# Patient Record
Sex: Female | Born: 1985 | Race: White | Hispanic: No | Marital: Single | Smoking: Former smoker
Health system: Southern US, Community
[De-identification: ages and names within clinical notes are randomized; demographics above are authoritative.]

## PROBLEM LIST (undated history)

## (undated) DIAGNOSIS — L739 Follicular disorder, unspecified: Secondary | ICD-10-CM

## (undated) HISTORY — PX: NO PAST SURGERIES: SHX2092

## (undated) HISTORY — PX: TONSILLECTOMY: SUR1361

---

## 2009-10-15 ENCOUNTER — Emergency Department (HOSPITAL_COMMUNITY): Admission: EM | Admit: 2009-10-15 | Discharge: 2009-10-15 | Payer: Self-pay | Admitting: Emergency Medicine

## 2009-10-28 ENCOUNTER — Emergency Department (HOSPITAL_COMMUNITY): Admission: EM | Admit: 2009-10-28 | Discharge: 2009-10-28 | Payer: Self-pay | Admitting: Emergency Medicine
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
## 2010-12-21 LAB — HEMOCCULT GUIAC POC 1CARD (OFFICE): Fecal Occult Bld: NEGATIVE

## 2010-12-21 LAB — POCT I-STAT, CHEM 8
BUN: 11 mg/dL (ref 6–23)
HCT: 40 % (ref 36.0–46.0)
Hemoglobin: 13.6 g/dL (ref 12.0–15.0)
Sodium: 141 mEq/L (ref 135–145)
TCO2: 28 mmol/L (ref 0–100)

## 2010-12-21 LAB — PREGNANCY, URINE: Preg Test, Ur: NEGATIVE

## 2012-07-18 ENCOUNTER — Ambulatory Visit (INDEPENDENT_AMBULATORY_CARE_PROVIDER_SITE_OTHER): Payer: Self-pay | Admitting: Family Medicine

## 2012-07-18 ENCOUNTER — Encounter: Payer: Self-pay | Admitting: Family Medicine

## 2012-07-18 VITALS — BP 115/75 | HR 75 | Ht 63.0 in | Wt 125.0 lb

## 2012-07-18 DIAGNOSIS — N6310 Unspecified lump in the right breast, unspecified quadrant: Secondary | ICD-10-CM

## 2012-07-18 DIAGNOSIS — N63 Unspecified lump in unspecified breast: Secondary | ICD-10-CM

## 2012-07-18 DIAGNOSIS — R5383 Other fatigue: Secondary | ICD-10-CM

## 2012-07-18 DIAGNOSIS — R5381 Other malaise: Secondary | ICD-10-CM

## 2012-07-18 NOTE — Progress Notes (Signed)
Subjective:    Patient ID: Samantha Rodgers, female    DOB: 29-Jun-1986, 26 y.o.   MRN: [id number]  HPI Lump in breast x 2 years and occas pain, no discharge.  She is on OCP.  No discharge from the nipples.  Sometimes has pain in her ribs and occ feels SOB with it. The lumps have been constant and have gotten a lot of larger. Feels tired all the time.  No fever.  Periods are fairly regularly.  Says peroids are heavy first couple of days. 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 Has been anemic in the past. Does take OTC iron. MGM with BrCa.    Fatigue-she complains of feeling tired all the time. She does not sleep well but this is been chronic. She says she remembers not sleeping well her entire life. On average she only gets about 5-6 hours. She says she gets up from her father. No other recent changes. No fevers. She does have a lump in her breast but no other lumps or bumps that she is worried about. She does have heavy periods the first 2 days but then it tapers off. She's on birth control.    Review of Systems  Constitutional: Negative for fever, diaphoresis and unexpected weight change.  HENT: Negative for hearing loss, rhinorrhea and tinnitus.   Eyes: Negative for visual disturbance.  Respiratory: Negative for cough and wheezing.   Cardiovascular: Negative for chest pain and palpitations.  Gastrointestinal: Negative for nausea, vomiting, diarrhea and blood in stool.  Genitourinary: Negative for vaginal bleeding, vaginal discharge and difficulty urinating.  Musculoskeletal: Negative for myalgias and arthralgias.  Skin: Negative for rash.  Neurological: Negative for headaches.  Hematological: Negative for adenopathy. Does not bruise/bleed easily.  Psychiatric/Behavioral: Negative for disturbed wake/sleep cycle and dysphoric mood. The patient is not nervous/anxious.    BP 115/75  Pulse 75  Ht 5\' 3"  (1.6 m)  Wt 125 lb (56.7 kg)  BMI 22.14 kg/m2    No Known Allergies  History reviewed. No pertinent  past medical history.  Past Surgical History  Procedure Date  . No past surgeries     History   Social History  . Marital Status: Single    Spouse Name: N/A    Number of Children: N/A  . Years of Education: N/A   Occupational History  . Store Marine scientist   Social History Main Topics  . Smoking status: Former Games developer  . Smokeless tobacco: Not on file  . Alcohol Use: No  . Drug Use: No  . Sexually Active: Yes    Birth Control/ Protection: Pill   Other Topics Concern  . Not on file   Social History Narrative   No caffeine intake.      Family History  Problem Relation Age of Onset  . Cancer Maternal Grandmother   . Hypertension Maternal Grandmother   . Diabetes Maternal Grandmother   . Leukemia Paternal Grandfather   . Diabetes Maternal Grandmother     Outpatient Encounter Prescriptions as of 07/18/2012  Medication Sig Dispense Refill  . Norgestim-Eth Estrad Triphasic (TRI-SPRINTEC PO) Take 1 tablet by mouth daily.              Objective:   Physical Exam  Constitutional: She appears well-developed and well-nourished.  HENT:  Head: Normocephalic and atraumatic.  Pulmonary/Chest: Right breast exhibits mass. Right breast exhibits no inverted nipple, no nipple discharge, no skin change and no tenderness. Left breast exhibits no inverted nipple, no nipple  discharge, no skin change and no tenderness. Breasts are symmetrical.    Skin: Skin is warm and dry.  Psychiatric: She has a normal mood and affect. Her behavior is normal.          Assessment & Plan:  Breast lump-will refer to imaging for ultrasound and diagnostic mammogram. Will call with results as it is there are available. Tried to give her reassurance for now.  Fatigue-we'll check a CBC to rule out anemia as well as B12, folic acid and ferritin. She does have a prior history of iron deficiency anemia. We'll also check her thyroid level as well.

## 2012-07-18 NOTE — Patient Instructions (Addendum)
We will call you with your lab results. If you don't here from [place] in about a week then please give [place] a call at [phone].,

## 2012-07-20 ENCOUNTER — Other Ambulatory Visit: Payer: Self-pay | Admitting: Family Medicine

## 2012-07-20 ENCOUNTER — Ambulatory Visit
Admission: RE | Admit: 2012-07-20 | Discharge: 2012-07-20 | Disposition: A | Discharge status: Home / Self Care | Payer: Self-pay | Source: Ambulatory Visit | Attending: Family Medicine | Admitting: Family Medicine

## 2012-07-20 DIAGNOSIS — N6310 Unspecified lump in the right breast, unspecified quadrant: Secondary | ICD-10-CM

## 2012-07-27 ENCOUNTER — Ambulatory Visit (HOSPITAL_COMMUNITY): Payer: Self-pay

## 2012-11-30 ENCOUNTER — Encounter (HOSPITAL_COMMUNITY): Payer: Self-pay | Admitting: *Deleted

## 2012-12-12 ENCOUNTER — Encounter (HOSPITAL_COMMUNITY): Payer: Self-pay

## 2012-12-12 ENCOUNTER — Ambulatory Visit (HOSPITAL_COMMUNITY)
Admission: RE | Admit: 2012-12-12 | Discharge: 2012-12-12 | Disposition: A | Discharge status: Home / Self Care | Payer: Self-pay | Source: Ambulatory Visit | Attending: Obstetrics and Gynecology | Admitting: Obstetrics and Gynecology

## 2012-12-12 ENCOUNTER — Other Ambulatory Visit: Payer: Self-pay | Admitting: Family Medicine

## 2012-12-12 ENCOUNTER — Other Ambulatory Visit: Payer: Self-pay | Admitting: Obstetrics and Gynecology

## 2012-12-12 VITALS — BP 106/62 | Temp 98.6°F | Ht 63.0 in | Wt 129.2 lb

## 2012-12-12 DIAGNOSIS — N6310 Unspecified lump in the right breast, unspecified quadrant: Secondary | ICD-10-CM

## 2012-12-12 DIAGNOSIS — N6311 Unspecified lump in the right breast, upper outer quadrant: Secondary | ICD-10-CM | POA: Insufficient documentation

## 2012-12-12 DIAGNOSIS — Z01419 Encounter for gynecological examination (general) (routine) without abnormal findings: Secondary | ICD-10-CM

## 2012-12-12 NOTE — Patient Instructions (Signed)
Taught patient how to perform BSE. Let her know [facility] will cover Pap smears every 3 years unless has a history of abnormal Pap smears. Referred patient to the [facility] per recommendation for a right breast biopsy. Appointment is scheduled for Friday, December 15, 2012 at 1245. Patient aware of appointment and will be there. Let patient know will follow up with her within the next couple weeks with results for Pap smear by letter or phone. Patient verbalized understanding.

## 2012-12-12 NOTE — Progress Notes (Signed)
Complaints of right breast lump x 3 years that has increased in size. Complained of right breast pain x 1.5 years that comes and goes rating pain at a 9 out of 10. Patient referred to [facility] by the [facility] due to recommendation of right breast biopsy. Right breast ultrasound completed 07/20/2012 at the [facility] [place].  Pap Smear:    Pap smear completed today. Per patient unsure of the date of her last Pap smear though stated is was normal. Per patient no history of abnormal Pap smears. No Pap smear results in EPIC.  Physical exam: Breasts Breasts symmetrical. No skin abnormalities bilateral breasts. No nipple retraction bilateral breasts. No nipple discharge bilateral breasts. No lymphadenopathy. No lumps palpated left breast. Palpated a lump within the right breast at 10 o'clock 1 cm from the nipple. No complaints of pain or tenderness on exam. Patient did state she has severe shooting right breast pain that comes and goes. Referred patient to the [facility] per recommendation for a right breast biopsy. Appointment is scheduled for Friday, December 15, 2012 at 1245.        Pelvic/Bimanual   Ext Genitalia No lesions, no swelling and no discharge observed on external genitalia.          Vagina Vagina pink and normal texture. No lesions and small amount of whitish colored discharge observed in vagina.          Cervix Cervix is present. Cervix pink and of normal texture. Small amount of whitish colored discharge observed on cervical os.    Uterus Uterus is present and palpable. Uterus in normal position and normal size.        Adnexae Bilateral ovaries present and palpable. No tenderness on palpation.          Rectovaginal No rectal exam completed today since patient had no rectal complaints. No skin abnormalities observed on exam.

## 2012-12-15 ENCOUNTER — Other Ambulatory Visit: Payer: Self-pay

## 2012-12-15 ENCOUNTER — Other Ambulatory Visit: Payer: Self-pay | Admitting: Obstetrics and Gynecology

## 2012-12-15 ENCOUNTER — Ambulatory Visit
Admission: RE | Admit: 2012-12-15 | Discharge: 2012-12-15 | Disposition: A | Discharge status: Home / Self Care | Payer: No Typology Code available for payment source | Source: Ambulatory Visit | Attending: Obstetrics and Gynecology | Admitting: Obstetrics and Gynecology

## 2012-12-15 ENCOUNTER — Ambulatory Visit
Admission: RE | Admit: 2012-12-15 | Discharge: 2012-12-15 | Disposition: A | Discharge status: Home / Self Care | Payer: No Typology Code available for payment source | Source: Ambulatory Visit | Attending: Family Medicine | Admitting: Family Medicine

## 2012-12-15 DIAGNOSIS — N6310 Unspecified lump in the right breast, unspecified quadrant: Secondary | ICD-10-CM

## 2012-12-19 ENCOUNTER — Encounter: Payer: Self-pay | Admitting: Family Medicine

## 2012-12-25 ENCOUNTER — Telehealth (HOSPITAL_COMMUNITY): Payer: Self-pay | Admitting: *Deleted

## 2012-12-25 ENCOUNTER — Other Ambulatory Visit: Payer: Self-pay | Admitting: *Deleted

## 2012-12-25 DIAGNOSIS — B379 Candidiasis, unspecified: Secondary | ICD-10-CM

## 2012-12-25 MED ORDER — FLUCONAZOLE 150 MG PO TABS: 150.0000 mg | ORAL_TABLET | Freq: Once | ORAL | Status: DC

## 2012-12-25 NOTE — Telephone Encounter (Signed)
Telephoned patient at home # and left message to return call to [facility] 

## 2013-05-28 ENCOUNTER — Encounter (HOSPITAL_COMMUNITY): Payer: Self-pay | Admitting: *Deleted

## 2014-08-05 ENCOUNTER — Encounter: Payer: Self-pay | Admitting: Family Medicine

## 2019-09-10 ENCOUNTER — Emergency Department
Admission: EM | Admit: 2019-09-10 | Discharge: 2019-09-10 | Disposition: A | Discharge status: Home / Self Care | Payer: Self-pay | Source: Home / Self Care

## 2019-09-10 ENCOUNTER — Other Ambulatory Visit: Payer: Self-pay

## 2019-09-10 ENCOUNTER — Encounter: Payer: Self-pay | Admitting: *Deleted

## 2019-09-10 ENCOUNTER — Other Ambulatory Visit (HOSPITAL_COMMUNITY)
Admission: RE | Admit: 2019-09-10 | Discharge: 2019-09-10 | Disposition: A | Discharge status: Home / Self Care | Payer: Self-pay | Source: Ambulatory Visit | Attending: Family Medicine | Admitting: Family Medicine

## 2019-09-10 DIAGNOSIS — N898 Other specified noninflammatory disorders of vagina: Secondary | ICD-10-CM | POA: Insufficient documentation

## 2019-09-10 DIAGNOSIS — R300 Dysuria: Secondary | ICD-10-CM

## 2019-09-10 DIAGNOSIS — N3001 Acute cystitis with hematuria: Secondary | ICD-10-CM

## 2019-09-10 DIAGNOSIS — R030 Elevated blood-pressure reading, without diagnosis of hypertension: Secondary | ICD-10-CM | POA: Insufficient documentation

## 2019-09-10 LAB — POCT URINALYSIS DIP (MANUAL ENTRY)
Bilirubin, UA: NEGATIVE
Glucose, UA: NEGATIVE mg/dL
Ketones, POC UA: NEGATIVE mg/dL
Nitrite, UA: POSITIVE — AB
Spec Grav, UA: >=1.03 — AB (ref 1.010–1.025)
Urobilinogen, UA: 0.2 E.U./dL
pH, UA: 5.5 (ref 5.0–8.0)

## 2019-09-10 LAB — POCT URINE PREGNANCY: Preg Test, Ur: NEGATIVE

## 2019-09-10 MED ORDER — CEPHALEXIN 500 MG PO CAPS: 500.0000 mg | ORAL_CAPSULE | Freq: Two times a day (BID) | ORAL | 0 refills | Status: AC

## 2019-09-10 NOTE — Discharge Instructions (Signed)
STD panel pending: We will call you if anything is positive and needs to be treated. Take antibiotic as directed with food. Return for worsening symptoms, abdominal or back pain, fever.

## 2019-09-10 NOTE — ED Provider Notes (Signed)
[facility]    CSN: [id number] Arrival date & time: 09/10/19  1455      History   Chief Complaint Chief Complaint  Patient presents with  . Dysuria    HPI Samantha Rodgers is a 33 y.o. female with history of herpes presenting for 3-week course of intermittent dysuria, bladder pressure.  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  Denies burning sensation with urination, the states if she "holds it longer than she should all feel little pressure ".  Patient denies hematuria, pelvic or abdominal pain.  No back pain.  Patient is currently sexually active with 1 female partner, usually use condoms-no known STD contact.  States she is interested in STD testing given previous infidelity in conjunction with discharge.  States discharge started about 3 weeks ago, she took Monistat with some relief.  Denies history of BV, yeast infection.  No vaginal irritation, anogenital lesions currently.[name]  LMP 11/11: Interested in pregnancy test as well.   History reviewed. No pertinent past medical history.  Patient Active Problem List   Diagnosis Date Noted  . Breast lump on right side at 10 o'clock position 12/12/2012    Past Surgical History:  Procedure Laterality Date  . NO PAST SURGERIES    . TONSILLECTOMY      OB History    Gravida  2   Para  1   Term  1   Preterm      AB  1   Living  1     SAB      TAB  1   Ectopic      Multiple      Live Births               Home Medications    Prior to Admission medications   Medication Sig Start Date End Date Taking? Authorizing Provider  valACYclovir (VALTREX) 500 MG tablet 1 po daily for suppression and 1 po BID x 3 days prn recurrent outbreaks. 04/05/19  Yes [provider]  cephALEXin (KEFLEX) 500 MG capsule Take 1 capsule (500 mg total) by mouth 2 (two) times daily for 5 days. 09/10/19 09/15/19  Hall-Potvin, [place], PA-C  JUNEL FE 1/20 1-20 MG-MCG tablet Take 1 tablet  by mouth daily. 08/23/19   [provider]    Family History Family History  Problem Relation Age of Onset  . Breast cancer Maternal Grandmother 31  . Hypertension Maternal Grandmother   . Diabetes Maternal Grandmother   . Leukemia Paternal Grandfather   . Ovarian cancer Mother        2s     Social History Social History   Tobacco Use  . Smoking status: Former Research scientist (life sciences)  . Smokeless tobacco: Never Used  Substance Use Topics  . Alcohol use: No  . Drug use: No     Allergies   Patient has no known allergies.   Review of Systems Review of Systems  Constitutional: Negative for fatigue and fever.  Respiratory: Negative for cough and shortness of breath.   Cardiovascular: Negative for chest pain and palpitations.  Gastrointestinal: Negative for constipation and diarrhea.  Genitourinary: Positive for frequency, urgency and vaginal discharge. Negative for flank pain, hematuria, pelvic pain, vaginal bleeding and vaginal pain.     Physical Exam Triage Vital Signs ED Triage Vitals  Enc Vitals Group     BP      Pulse      Resp  Temp      Temp src      SpO2      Weight      Height      Head Circumference      Peak Flow      Pain Score      Pain Loc      Pain Edu?      Excl. in GC?    No data found.  Updated Vital Signs BP 120/78 (BP Location: Right Arm)   Pulse 64   Temp 98.2 F (36.8 C) (Oral)   Resp 16   Ht 5\' 4"  (1.626 m)   Wt 135 lb (61.2 kg)   LMP 08/15/2019   SpO2 99%   BMI 23.17 kg/m   Visual Acuity Right Eye Distance:   Left Eye Distance:   Bilateral Distance:    Right Eye Near:   Left Eye Near:    Bilateral Near:     Physical Exam Constitutional:      General: She is not in acute distress.    Appearance: She is normal weight. She is not ill-appearing.  HENT:     Head: Normocephalic and atraumatic.  Eyes:     General: No scleral icterus.    Pupils: Pupils are equal, round, and reactive to light.  Cardiovascular:      Rate and Rhythm: Normal rate.  Pulmonary:     Effort: Pulmonary effort is normal.  Abdominal:     General: Abdomen is flat. Bowel sounds are normal.     Palpations: Abdomen is soft.     Tenderness: There is no abdominal tenderness. There is no right CVA tenderness, left CVA tenderness or guarding.  Genitourinary:    Comments: Patient declined, self-swab performed Skin:    Coloration: Skin is not jaundiced or pale.  Neurological:     Mental Status: She is alert and oriented to person, place, and time.      UC Treatments / Results  Labs (all labs ordered are listed, but only abnormal results are displayed) Labs Reviewed  POCT URINALYSIS DIP (MANUAL ENTRY) - Abnormal; Notable for the following components:      Result Value   Clarity, UA cloudy (*)    Spec Grav, UA >=1.030 (*)    Blood, UA small (*)    Protein Ur, POC trace (*)    Nitrite, UA Positive (*)    Leukocytes, UA Small (1+) (*)    All other components within normal limits  URINE CULTURE  POCT URINE PREGNANCY  CERVICOVAGINAL ANCILLARY ONLY    EKG   Radiology No results found.  Procedures Procedures (including critical care time)  Medications Ordered in UC Medications - No data to display  Initial Impression / Assessment and Plan / UC Course  I have reviewed the triage vital signs and the nursing notes.  Pertinent labs & imaging results that were available during my care of the patient were reviewed by me and considered in my medical decision making (see chart for details).     POC urine dipstick done in office, reviewed by me: Positive for leukocytes, nitrites, trace protein and blood.  Low concern for pyelonephritis given lack of back pain, CVA tenderness, fever: Will start Keflex today.  Urine pregnancy test done in office, reviewed by me: Negative.  STD panel pending: We will treat if indicated.  Return precautions discussed, patient verbalized understanding and is agreeable to plan. Final Clinical  Impressions(s) / UC Diagnoses   Final diagnoses:  Dysuria  Discharge of vagina  Acute cystitis with hematuria     Discharge Instructions     STD panel pending: We will call you if anything is positive and needs to be treated. Take antibiotic as directed with food. Return for worsening symptoms, abdominal or back pain, fever.    ED Prescriptions    Medication Sig Dispense Auth. Provider   cephALEXin (KEFLEX) 500 MG capsule Take 1 capsule (500 mg total) by mouth 2 (two) times daily for 5 days. 10 capsule Hall-Potvin, [place], PA-C     PDMP not reviewed this encounter.   Hall-Potvin, [place], [place] 09/10/19 1607

## 2019-09-10 NOTE — ED Triage Notes (Signed)
Pt c/o dysuria and lower abd pain x 3-4 wks. 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 Also, reports pale yellow d/c.

## 2019-09-10 NOTE — ED Notes (Signed)
Password for labs: [employer]

## 2019-09-13 LAB — URINE CULTURE
MICRO NUMBER:: 1170806
SPECIMEN QUALITY:: ADEQUATE

## 2019-09-13 LAB — CERVICOVAGINAL ANCILLARY ONLY
Bacterial vaginitis: POSITIVE — AB
Neisseria Gonorrhea: NEGATIVE
Trichomonas: POSITIVE — AB

## 2019-09-14 ENCOUNTER — Telehealth: Payer: Self-pay | Admitting: Emergency Medicine

## 2019-09-14 MED ORDER — METRONIDAZOLE 500 MG PO TABS: 500.0000 mg | ORAL_TABLET | Freq: Two times a day (BID) | ORAL | 0 refills | Status: DC

## 2019-09-14 NOTE — Telephone Encounter (Signed)
Patient saw positive test results mychart and wants rx [facility] [address], [place].

## 2019-09-14 NOTE — Telephone Encounter (Signed)
Chart reviewed.  Patient tested positive for trichomonas Gardnerella.  She is on birth control pills.  She will be treated with metronidazole 500 mg twice a day for 7 days.

## 2019-09-18 LAB — CERVICOVAGINAL ANCILLARY ONLY
Candida vaginitis: NEGATIVE
Chlamydia: NEGATIVE

## 2020-01-22 ENCOUNTER — Emergency Department
Admission: EM | Admit: 2020-01-22 | Discharge: 2020-01-22 | Disposition: A | Discharge status: Home / Self Care | Payer: Self-pay | Source: Home / Self Care | Attending: Family Medicine | Admitting: Family Medicine

## 2020-01-22 ENCOUNTER — Other Ambulatory Visit: Payer: Self-pay

## 2020-01-22 DIAGNOSIS — L739 Follicular disorder, unspecified: Secondary | ICD-10-CM

## 2020-01-22 MED ORDER — DOXYCYCLINE HYCLATE 100 MG PO CAPS: 100.0000 mg | ORAL_CAPSULE | Freq: Two times a day (BID) | ORAL | 0 refills | Status: DC

## 2020-01-22 NOTE — ED Provider Notes (Signed)
[name]    CSN: [id number] Arrival date & time: 01/22/20  0912      History   Chief Complaint Chief Complaint  Patient presents with  . Rash    HPI Kewanna Kasprzak is a 34 y.o. female.   Patient developed a pruritic rash on her right leg below the knee about a month ago.  She now has several lesions on her left leg, and 2 or 3 on her arms.  She notes that she shaves her legs.  The history is provided by the patient.  Rash Location:  Leg Leg rash location:  L lower leg and R lower leg Quality: dryness, itchiness and redness   Quality: not blistering, not bruising, not burning, not draining, not painful, not peeling, not scaling, not swelling and not weeping   Severity:  Mild Onset quality:  Sudden Duration:  1 month Timing:  Constant Progression:  Spreading Chronicity:  New Context: not animal contact, not chemical exposure, not diapers, not eggs, not exposure to similar rash, not food, not hot tub use, not insect bite/sting, not medications, not new detergent/soap, not nuts and not plant contact   Relieved by:  Nothing Worsened by:  Nothing Ineffective treatments:  Topical steroids Associated symptoms: no abdominal pain, no diarrhea, no fatigue, no fever, no induration, no joint pain, no myalgias, no nausea and no sore throat     History reviewed. No pertinent past medical history.  Patient Active Problem List   Diagnosis Date Noted  . Breast lump on right side at 10 o'clock position 12/12/2012    Past Surgical History:  Procedure Laterality Date  . NO PAST SURGERIES    . TONSILLECTOMY      OB History    Gravida  2   Para  1   Term  1   Preterm      AB  1   Living  1     SAB      TAB  1   Ectopic      Multiple      Live Births               Home Medications    Prior to Admission medications   Medication Sig Start Date End Date Taking? Authorizing Provider  doxycycline (VIBRAMYCIN) 100 MG capsule Take 1 capsule (100  mg total) by mouth 2 (two) times daily. Take with food. 01/22/20   Kandra Nicolas, MD  JUNEL FE 1/20 1-20 MG-MCG tablet Take 1 tablet by mouth daily. 08/23/19   [provider]  metroNIDAZOLE (FLAGYL) 500 MG tablet Take 1 tablet (500 mg total) by mouth 2 (two) times daily. 09/14/19   Darlyne Russian, MD  valACYclovir (VALTREX) 500 MG tablet 1 po daily for suppression and 1 po BID x 3 days prn recurrent outbreaks. 04/05/19   [provider]    Family History Family History  Problem Relation Age of Onset  . Breast cancer Maternal Grandmother 31  . Hypertension Maternal Grandmother   . Diabetes Maternal Grandmother   . Leukemia Paternal Grandfather   . Ovarian cancer Mother        51s     Social History Social History   Tobacco Use  . Smoking status: Former Research scientist (life sciences)  . Smokeless tobacco: Never Used  Substance Use Topics  . Alcohol use: No  . Drug use: No     Allergies   Patient has no known allergies.   Review of Systems Review of Systems  Constitutional: Negative for chills, diaphoresis, fatigue and fever.  HENT: Negative for sore throat.   Gastrointestinal: Negative for abdominal pain, diarrhea and nausea.  Musculoskeletal: Negative for arthralgias and myalgias.  Skin: Positive for rash.  All other systems reviewed and are negative.    Physical Exam Triage Vital Signs ED Triage Vitals  Enc Vitals Group     BP 01/22/20 0931 118/80     Pulse Rate 01/22/20 0931 69     Resp --      Temp 01/22/20 0931 98.9 F (37.2 C)     Temp Source 01/22/20 0931 Oral     SpO2 01/22/20 0931 99 %     Weight --      Height --      Head Circumference --      Peak Flow --      Pain Score 01/22/20 0933 2     Pain Loc --      Pain Edu? --      Excl. in GC? --    No data found.  Updated Vital Signs BP 118/80 (BP Location: Left Arm)   Pulse 69   Temp 98.9 F (37.2 C) (Oral)   SpO2 99%   Visual Acuity Right Eye Distance:   Left Eye Distance:   Bilateral  Distance:    Right Eye Near:   Left Eye Near:    Bilateral Near:     Physical Exam Vitals and nursing note reviewed.  Constitutional:      General: She is not in acute distress. HENT:     Head: Normocephalic.     Nose: Nose normal.     Mouth/Throat:     Mouth: Mucous membranes are moist.     Pharynx: Oropharynx is clear. No posterior oropharyngeal erythema.  Eyes:     Pupils: Pupils are equal, round, and reactive to light.  Cardiovascular:     Rate and Rhythm: Normal rate.  Pulmonary:     Effort: Pulmonary effort is normal.  Abdominal:     General: Abdomen is flat.     Tenderness: There is no abdominal tenderness.  Musculoskeletal:     Right lower leg: No edema.     Left lower leg: No edema.       Legs:  Lymphadenopathy:     Cervical: No cervical adenopathy.  Skin:    General: Skin is warm and dry.     Findings: Rash present.     Comments: On the lower legs below knees there are scattered follicular erythematous papules with tiny central pustules.  There is no induration or fluctuance and lesions are not tender to palpation.  There are several small similar lesions on forearms.   Neurological:     Mental Status: She is alert.      UC Treatments / Results  Labs (all labs ordered are listed, but only abnormal results are displayed) Labs Reviewed - No data to display  EKG   Radiology No results found.  Procedures Procedures (including critical care time)  Medications Ordered in UC Medications - No data to display  Initial Impression / Assessment and Plan / UC Course  I have reviewed the triage vital signs and the nursing notes.  Pertinent labs & imaging results that were available during my care of the patient were reviewed by me and considered in my medical decision making (see chart for details).    Begin doxycycline. Followup with dermatologist if not resolved in two weeks.   Final Clinical Impressions(s) /  UC Diagnoses   Final diagnoses:    Folliculitis     Discharge Instructions     May take Benadryl at bedtime if needed for itching.    ED Prescriptions    Medication Sig Dispense Auth. Provider   doxycycline (VIBRAMYCIN) 100 MG capsule Take 1 capsule (100 mg total) by mouth 2 (two) times daily. Take with food. 20 capsule Lattie Haw, MD        Lattie Haw, MD 01/23/20 [id number]

## 2020-01-22 NOTE — ED Triage Notes (Signed)
Pt c/o rash on lower legs x 1 mos. Started on lower right leg. No has traveled to other leg. Painful and itchy. No changes in dietary habits or detergent. Has tried cortizone 10 OTC with no relief.

## 2020-01-22 NOTE — Discharge Instructions (Addendum)
May take Benadryl at bedtime if needed for itching. 

## 2020-02-15 ENCOUNTER — Encounter: Payer: Self-pay | Admitting: Emergency Medicine

## 2020-02-15 ENCOUNTER — Emergency Department
Admission: EM | Admit: 2020-02-15 | Discharge: 2020-02-15 | Disposition: A | Discharge status: Home / Self Care | Payer: Self-pay | Source: Home / Self Care

## 2020-02-15 ENCOUNTER — Other Ambulatory Visit: Payer: Self-pay

## 2020-02-15 DIAGNOSIS — L03115 Cellulitis of right lower limb: Secondary | ICD-10-CM

## 2020-02-15 DIAGNOSIS — L739 Follicular disorder, unspecified: Secondary | ICD-10-CM | POA: Insufficient documentation

## 2020-02-15 DIAGNOSIS — L089 Local infection of the skin and subcutaneous tissue, unspecified: Secondary | ICD-10-CM

## 2020-02-15 HISTORY — DX: Follicular disorder, unspecified: L73.9

## 2020-02-15 MED ORDER — TRIAMCINOLONE ACETONIDE 0.1 % EX CREA: 1.0000 "application " | TOPICAL_CREAM | Freq: Two times a day (BID) | CUTANEOUS | 0 refills | Status: DC

## 2020-02-15 MED ORDER — CEPHALEXIN 500 MG PO CAPS: 500.0000 mg | ORAL_CAPSULE | Freq: Two times a day (BID) | ORAL | 0 refills | Status: DC

## 2020-02-15 MED ORDER — CETIRIZINE HCL 10 MG PO TABS: 10.0000 mg | ORAL_TABLET | Freq: Every day | ORAL | 0 refills | Status: DC

## 2020-02-15 NOTE — ED Triage Notes (Signed)
Return or small bump to r post calf - pt states she missed doses of doxycycline prescribed at last visit Current rash/infection contained to RLE Nurse educated pat on stting an alarm on phone to take meds as directed Pt verbalized an understanding

## 2020-02-15 NOTE — Discharge Instructions (Signed)
  You may try soaking in [place] or oatmeal bath soak to help with itching. You may also alternate cool or warm damp washcloths to help with itching, whichever feels better to you. Try to avoid using hot water and scratching your legs as this can make itching worse. Be sure to change out your razor blade to help prevent recurrent infection. If possible, try to avoid shaving or waxing your legs until symptoms resolve.  Follow up in 3-4 days in not improving.   Please take antibiotics as prescribed and be sure to complete entire course even if you start to feel better to ensure infection does not come back.

## 2020-02-15 NOTE — ED Provider Notes (Signed)
[name]    CSN: [id number] Arrival date & time: 02/15/20  0817      History   Chief Complaint Chief Complaint  Patient presents with  . Wound Infection    R post calf    HPI Samantha Rodgers is a 34 y.o. female.   HPI Samantha Rodgers is a 34 y.o. female presenting to UC with c/o red itchy, minimally painful pus looking bump on back of her Right calf. Hx of similar symptoms with multiple bumps on both legs that started about a month ago. She was prescribed doxycycline BID but admits to only taking it once daily most days. She is concerned the infection has come back. 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  Denies sick contacts or recent yard work or hiking. Denies fever, chills, n/v/d. No OTC medications tried.   Past Medical History:  Diagnosis Date  . Folliculitis     Patient Active Problem List   Diagnosis Date Noted  . Folliculitis   . Breast lump on right side at 10 o'clock position 12/12/2012    Past Surgical History:  Procedure Laterality Date  . NO PAST SURGERIES    . TONSILLECTOMY      OB History    Gravida  2   Para  1   Term  1   Preterm      AB  1   Living  1     SAB      TAB  1   Ectopic      Multiple      Live Births               Home Medications    Prior to Admission medications   Medication Sig Start Date End Date Taking? Authorizing Provider  JUNEL FE 1/20 1-20 MG-MCG tablet Take 1 tablet by mouth daily. 08/23/19  Yes [provider]  valACYclovir (VALTREX) 500 MG tablet 1 po daily for suppression and 1 po BID x 3 days prn recurrent outbreaks. 04/05/19  Yes [provider]  cephALEXin (KEFLEX) 500 MG capsule Take 1 capsule (500 mg total) by mouth 2 (two) times daily. 02/15/20   Lurene Shadow, PA-C  cetirizine (ZYRTEC) 10 MG tablet Take 1 tablet (10 mg total) by mouth daily. 02/15/20   Lurene Shadow, PA-C  doxycycline (VIBRAMYCIN) 100 MG capsule Take 1 capsule (100 mg total) by mouth 2  (two) times daily. Take with food. 01/22/20   Lattie Haw, MD  metroNIDAZOLE (FLAGYL) 500 MG tablet Take 1 tablet (500 mg total) by mouth 2 (two) times daily. 09/14/19   Collene Gobble, MD  triamcinolone cream (KENALOG) 0.1 % Apply 1 application topically 2 (two) times daily. 02/15/20   Lurene Shadow, PA-C    Family History Family History  Problem Relation Age of Onset  . Breast cancer Maternal Grandmother 31  . Hypertension Maternal Grandmother   . Diabetes Maternal Grandmother   . Leukemia Paternal Grandfather   . Ovarian cancer Mother        31s     Social History Social History   Tobacco Use  . Smoking status: Former Games developer  . Smokeless tobacco: Never Used  Substance Use Topics  . Alcohol use: No  . Drug use: No     Allergies   Patient has no known allergies.   Review of Systems Review of Systems  Constitutional: Negative for chills and fever.  Musculoskeletal: Negative for arthralgias and myalgias.  Skin: Positive for color change. Negative for wound.     Physical Exam Triage Vital Signs ED Triage Vitals  Enc Vitals Group     BP --      Pulse Rate 02/15/20 0841 74     Resp 02/15/20 0841 18     Temp 02/15/20 0844 97.6 F (36.4 C)     Temp Source 02/15/20 0841 Oral     SpO2 02/15/20 0841 100 %     Weight 02/15/20 0845 134 lb 14.7 oz (61.2 kg)     Height 02/15/20 0845 5\' 4"  (1.626 m)     Head Circumference --      Peak Flow --      Pain Score --      Pain Loc --      Pain Edu? --      Excl. in GC? --    No data found.  Updated Vital Signs BP 115/75 (BP Location: Right Arm)   Pulse 74   Temp 97.6 F (36.4 C) (Oral)   Resp 18   Ht 5\' 4"  (1.626 m)   Wt 134 lb 14.7 oz (61.2 kg)   SpO2 100%   BMI 23.16 kg/m   Visual Acuity Right Eye Distance:   Left Eye Distance:   Bilateral Distance:    Right Eye Near:   Left Eye Near:    Bilateral Near:     Physical Exam Vitals and nursing note reviewed.  Constitutional:      Appearance: She is  well-developed.  HENT:     Head: Normocephalic and atraumatic.  Cardiovascular:     Rate and Rhythm: Normal rate.  Pulmonary:     Effort: Pulmonary effort is normal.  Musculoskeletal:        General: Normal range of motion.     Cervical back: Normal range of motion.  Skin:    General: Skin is warm and dry.     Findings: Erythema present.       Neurological:     Mental Status: She is alert and oriented to person, place, and time.  Psychiatric:        Behavior: Behavior normal.      UC Treatments / Results  Labs (all labs ordered are listed, but only abnormal results are displayed) Labs Reviewed - No data to display  EKG   Radiology No results found.  Procedures Procedures (including critical care time)  Medications Ordered in UC Medications - No data to display  Initial Impression / Assessment and Plan / UC Course  I have reviewed the triage vital signs and the nursing notes.  Pertinent labs & imaging results that were available during my care of the patient were reviewed by me and considered in my medical decision making (see chart for details).     Hx and exam c/w early skin infection Will start pt on cephalexin and also tx itching with topical triamcinolone and oral cetirizine Home care instructions provided F/u with PCP as needed.   Final Clinical Impressions(s) / UC Diagnoses   Final diagnoses:  Skin pustule  Cellulitis of right lower leg     Discharge Instructions      You may try soaking in [place] or oatmeal bath soak to help with itching. You may also alternate cool or warm damp washcloths to help with itching, whichever feels better to you. Try to avoid using hot water and scratching your legs as this can make itching worse. Be sure to change out your razor blade  to help prevent recurrent infection. If possible, try to avoid shaving or waxing your legs until symptoms resolve.  Follow up in 3-4 days in not improving.   Please take  antibiotics as prescribed and be sure to complete entire course even if you start to feel better to ensure infection does not come back.     ED Prescriptions    Medication Sig Dispense Auth. Provider   cephALEXin (KEFLEX) 500 MG capsule Take 1 capsule (500 mg total) by mouth 2 (two) times daily. 14 capsule Leeroy Cha O, PA-C   triamcinolone cream (KENALOG) 0.1 % Apply 1 application topically 2 (two) times daily. 30 g Noe Gens, PA-C   cetirizine (ZYRTEC) 10 MG tablet Take 1 tablet (10 mg total) by mouth daily. 30 tablet Noe Gens, [place]     PDMP not reviewed this encounter.   Noe Gens, [place] 02/15/20 1427

## 2020-11-24 ENCOUNTER — Other Ambulatory Visit: Payer: Self-pay

## 2020-11-24 ENCOUNTER — Telehealth: Payer: Self-pay

## 2020-11-24 ENCOUNTER — Emergency Department (INDEPENDENT_AMBULATORY_CARE_PROVIDER_SITE_OTHER)
Admission: EM | Admit: 2020-11-24 | Discharge: 2020-11-24 | Disposition: A | Discharge status: Home / Self Care | Payer: BLUE CROSS/BLUE SHIELD | Source: Home / Self Care | Attending: Family Medicine | Admitting: Family Medicine

## 2020-11-24 DIAGNOSIS — L739 Follicular disorder, unspecified: Secondary | ICD-10-CM | POA: Diagnosis not present

## 2020-11-24 MED ORDER — CHLORHEXIDINE GLUCONATE 4 % EX LIQD: Freq: Every day | CUTANEOUS | 1 refills | Status: DC | PRN

## 2020-11-24 MED ORDER — DOXYCYCLINE HYCLATE 100 MG PO CAPS: 100.0000 mg | ORAL_CAPSULE | Freq: Two times a day (BID) | ORAL | 0 refills | Status: DC

## 2020-11-24 MED ORDER — CLINDAMYCIN PHOSPHATE 1 % EX LOTN
TOPICAL_LOTION | Freq: Two times a day (BID) | CUTANEOUS | 1 refills | Status: DC
Start: 2020-11-24 — End: 2022-09-20

## 2020-11-24 NOTE — Telephone Encounter (Signed)
Pharmacy called stating clindamycin lotion is on backorder. Per Dr Delton See, ok to change to clindamycin phosphate solution.

## 2020-11-24 NOTE — Discharge Instructions (Addendum)
Scrub shin daily with the chlorhexidine.  I gave you a prescription, but it is available OTC.  This is to be used daily for prevention The clindamycin lotion has antibiotic in it.  This is used when you have an active infection Take the doxycycline antibiotic 2 times a day until gone.  Take with food.  This should clear up the infection you have right now Your culture result will be available on MyChart in a couple of days

## 2020-11-24 NOTE — ED Triage Notes (Signed)
Pt c/o rash on lower LT leg x 2 weeks. Says she has been seen for similar sxs. Dx with infected hair follicle. Itchy and painful when scratches. Using triamcinolone prn.

## 2020-11-24 NOTE — ED Provider Notes (Signed)
[facility]    CSN: [id number] Arrival date & time: 11/24/20  0805      History   Chief Complaint Chief Complaint  Patient presents with  . Rash    Lt lower leg    HPI Samantha Rodgers is a 35 y.o. female.   HPI   34 year old healthy female.  On no ongoing medication except for occasional Zyrtec.  She is on oral contraceptives.  She has had recurrent problems with rash on her lower legs off and on since last spring (1 year).  Occasionally will get a lesion on her arm.  She gets bumps all over the back of her leg one of the other and they feel with pus "like a pimple".  They hurt.  No itching.  Patient works as a Naval architect.  She states that she uses disposable razors and does not think that they harbor infection.  Past Medical History:  Diagnosis Date  . Folliculitis     Patient Active Problem List   Diagnosis Date Noted  . Folliculitis   . Breast lump on right side at 10 o'clock position 12/12/2012    Past Surgical History:  Procedure Laterality Date  . NO PAST SURGERIES    . TONSILLECTOMY      OB History    Gravida  2   Para  1   Term  1   Preterm      AB  1   Living  1     SAB      IAB  1   Ectopic      Multiple      Live Births               Home Medications    Prior to Admission medications   Medication Sig Start Date End Date Taking? Authorizing Provider  chlorhexidine (HIBICLENS) 4 % external liquid Apply topically daily as needed. Wash lower legs daily 11/24/20  Yes Eustace Moore, MD  clindamycin (CLEOCIN-T) 1 % lotion Apply topically 2 (two) times daily. 11/24/20  Yes Eustace Moore, MD  cetirizine (ZYRTEC) 10 MG tablet Take 1 tablet (10 mg total) by mouth daily. 02/15/20   Lurene Shadow, PA-C  doxycycline (VIBRAMYCIN) 100 MG capsule Take 1 capsule (100 mg total) by mouth 2 (two) times daily. Take with food. 11/24/20   Eustace Moore, MD  JUNEL FE 1/20 1-20 MG-MCG tablet Take 1 tablet by mouth daily.  08/23/19   [provider]  valACYclovir (VALTREX) 500 MG tablet 1 po daily for suppression and 1 po BID x 3 days prn recurrent outbreaks. 04/05/19   [provider]    Family History Family History  Problem Relation Age of Onset  . Breast cancer Maternal Grandmother 31  . Hypertension Maternal Grandmother   . Diabetes Maternal Grandmother   . Leukemia Paternal Grandfather   . Ovarian cancer Mother        68s     Social History Social History   Tobacco Use  . Smoking status: Former Games developer  . Smokeless tobacco: Never Used  Vaping Use  . Vaping Use: Never used  Substance Use Topics  . Alcohol use: No  . Drug use: No     Allergies   Patient has no known allergies.   Review of Systems Review of Systems  See HPI Physical Exam Triage Vital Signs ED Triage Vitals  Enc Vitals Group     BP 11/24/20 0815 117/77  Pulse Rate 11/24/20 0815 65     Resp 11/24/20 0815 17     Temp 11/24/20 0815 98.2 F (36.8 C)     Temp Source 11/24/20 0815 Oral     SpO2 11/24/20 0815 98 %     Weight --      Height --      Head Circumference --      Peak Flow --      Pain Score 11/24/20 0817 2     Pain Loc --      Pain Edu? --      Excl. in GC? --    No data found.  Updated Vital Signs BP 117/77 (BP Location: Right Arm)   Pulse 65   Temp 98.2 F (36.8 C) (Oral)   Resp 17   LMP 11/18/2020 (Approximate)   SpO2 98%   Physical Exam Constitutional:      General: She is not in acute distress.    Appearance: She is well-developed, normal weight and well-nourished.     Comments: Mask is in place  HENT:     Head: Normocephalic and atraumatic.     Mouth/Throat:     Mouth: Oropharynx is clear and moist.  Eyes:     Conjunctiva/sclera: Conjunctivae normal.     Pupils: Pupils are equal, round, and reactive to light.  Cardiovascular:     Rate and Rhythm: Normal rate.  Pulmonary:     Effort: Pulmonary effort is normal. No respiratory distress.  Abdominal:      General: There is no distension.     Palpations: Abdomen is soft.  Musculoskeletal:        General: No edema. Normal range of motion.     Cervical back: Normal range of motion.  Skin:    General: Skin is warm and dry.     Comments: On the posterior aspect of the left lower leg there is a number of scattered pustules, many are active some are in stages of healing.  With patient's permission I unroofed 2 of these pustules and send culture for analysis.  Neurological:     General: No focal deficit present.     Mental Status: She is alert.  Psychiatric:        Behavior: Behavior normal.      UC Treatments / Results  Labs (all labs ordered are listed, but only abnormal results are displayed) Labs Reviewed  WOUND CULTURE    EKG   Radiology No results found.  Procedures Procedures (including critical care time)  Medications Ordered in UC Medications - No data to display  Initial Impression / Assessment and Plan / UC Course  I have reviewed the triage vital signs and the nursing notes.  Pertinent labs & imaging results that were available during my care of the patient were reviewed by me and considered in my medical decision making (see chart for details).     Reviewed management of current infection.  Prevention of future infections.  Return as needed Final Clinical Impressions(s) / UC Diagnoses   Final diagnoses:  Folliculitis     Discharge Instructions     Scrub shin daily with the chlorhexidine.  I gave you a prescription, but it is available OTC.  This is to be used daily for prevention The clindamycin lotion has antibiotic in it.  This is used when you have an active infection Take the doxycycline antibiotic 2 times a day until gone.  Take with food.  This should clear up the infection  you have right now Your culture result will be available on MyChart in a couple of days   ED Prescriptions    Medication Sig Dispense Auth. Provider   doxycycline (VIBRAMYCIN)  100 MG capsule Take 1 capsule (100 mg total) by mouth 2 (two) times daily. Take with food. 20 capsule Eustace Moore, MD   clindamycin (CLEOCIN-T) 1 % lotion Apply topically 2 (two) times daily. 60 mL Eustace Moore, MD   chlorhexidine (HIBICLENS) 4 % external liquid Apply topically daily as needed. Wash lower legs daily 120 mL Eustace Moore, MD     PDMP not reviewed this encounter.   Eustace Moore, MD 11/24/20 [phone]

## 2020-11-27 LAB — WOUND CULTURE
MICRO NUMBER:: 11558388
SPECIMEN QUALITY:: ADEQUATE

## 2022-09-20 ENCOUNTER — Ambulatory Visit
Admission: EM | Admit: 2022-09-20 | Discharge: 2022-09-20 | Disposition: A | Discharge status: Home / Self Care | Payer: BLUE CROSS/BLUE SHIELD | Attending: Family Medicine | Admitting: Family Medicine

## 2022-09-20 DIAGNOSIS — J029 Acute pharyngitis, unspecified: Secondary | ICD-10-CM | POA: Insufficient documentation

## 2022-09-20 DIAGNOSIS — J069 Acute upper respiratory infection, unspecified: Secondary | ICD-10-CM

## 2022-09-20 LAB — POCT RAPID STREP A (OFFICE): Rapid Strep A Screen: NEGATIVE

## 2022-09-20 LAB — POC SARS CORONAVIRUS 2 AG -  ED: SARS Coronavirus 2 Ag: NEGATIVE

## 2022-09-20 NOTE — ED Provider Notes (Signed)
[name]    CSN: [id number] Arrival date & time: 09/20/22  1342      History   Chief Complaint Chief Complaint  Patient presents with   Sore Throat   Nasal Congestion    HPI Samantha Rodgers is a 36 y.o. female.   HPI here for sore throat.  Is a "strep carrier".  She also complains of a runny stuffy nose.  Postnasal drip.  Her sore throat and coughing.  This has been going on for couple days.  Uncertain whether she could have COVID.  Has not had as much strep since her tonsils were removed.  Past Medical History:  Diagnosis Date   Folliculitis     Patient Active Problem List   Diagnosis Date Noted   Folliculitis    Breast lump on right side at 10 o'clock position 12/12/2012    Past Surgical History:  Procedure Laterality Date   NO PAST SURGERIES     TONSILLECTOMY      OB History     Gravida  2   Para  1   Term  1   Preterm      AB  1   Living  1      SAB      IAB  1   Ectopic      Multiple      Live Births               Home Medications    Prior to Admission medications   Not on File    Family History Family History  Problem Relation Age of Onset   Breast cancer Maternal Grandmother 31   Hypertension Maternal Grandmother    Diabetes Maternal Grandmother    Leukemia Paternal Grandfather    Ovarian cancer Mother        30s     Social History Social History   Tobacco Use   Smoking status: Former   Smokeless tobacco: Never  Building services engineer Use: Never used  Substance Use Topics   Alcohol use: No   Drug use: No     Allergies   Patient has no known allergies.   Review of Systems Review of Systems  See HPI Physical Exam Triage Vital Signs ED Triage Vitals  Enc Vitals Group     BP 09/20/22 1416 105/72     Pulse Rate 09/20/22 1416 67     Resp 09/20/22 1416 12     Temp 09/20/22 1416 98.7 F (37.1 C)     Temp Source 09/20/22 1416 Oral     SpO2 09/20/22 1416 98 %     Weight --      Height  --      Head Circumference --      Peak Flow --      Pain Score 09/20/22 1415 0     Pain Loc --      Pain Edu? --      Excl. in GC? --    No data found.  Updated Vital Signs BP 105/72 (BP Location: Right Arm)   Pulse 67   Temp 98.7 F (37.1 C) (Oral)   Resp 12   SpO2 98%      Physical Exam Constitutional:      General: She is not in acute distress.    Appearance: She is well-developed.  HENT:     Head: Normocephalic and atraumatic.     Right Ear: Tympanic membrane and ear canal normal.  Left Ear: Tympanic membrane and ear canal normal.     Nose: Rhinorrhea present. No congestion.     Mouth/Throat:     Mouth: Mucous membranes are moist.     Pharynx: Posterior oropharyngeal erythema present.     Comments: There is postnasal drip.  Cobblestoning of the posterior pharynx.  Moderate erythema.  No exudate Eyes:     Conjunctiva/sclera: Conjunctivae normal.     Pupils: Pupils are equal, round, and reactive to light.  Cardiovascular:     Rate and Rhythm: Normal rate and regular rhythm.     Heart sounds: Normal heart sounds.  Pulmonary:     Effort: Pulmonary effort is normal. No respiratory distress.     Breath sounds: Normal breath sounds.  Abdominal:     General: There is no distension.     Palpations: Abdomen is soft.  Musculoskeletal:        General: Normal range of motion.     Cervical back: Normal range of motion.  Lymphadenopathy:     Cervical: No cervical adenopathy.  Skin:    General: Skin is warm and dry.  Neurological:     Mental Status: She is alert.      UC Treatments / Results  Labs (all labs ordered are listed, but only abnormal results are displayed) Labs Reviewed  CULTURE, GROUP A STREP [facility])  POCT RAPID STREP A (OFFICE)  POC SARS CORONAVIRUS 2 AG -  ED    EKG   Radiology No results found.  Procedures Procedures (including critical care time)  Medications Ordered in UC Medications - No data to display  Initial Impression /  Assessment and Plan / UC Course  I have reviewed the triage vital signs and the nursing notes.  Pertinent labs & imaging results that were available during my care of the patient were reviewed by me and considered in my medical decision making (see chart for details).    Strep test is negative culture will cover lab COVID test is negative f Final Clinical Impressions(s) / UC Diagnoses   Final diagnoses:  Viral upper respiratory tract infection     Discharge Instructions      Drink lots of fluids May continue Mucinex With COVID and strep negative, you may return to work tomorrow     ED Prescriptions   None    PDMP not reviewed this encounter.   Eustace Moore, MD 09/20/22 [phone]

## 2022-09-20 NOTE — Discharge Instructions (Signed)
Drink lots of fluids May continue Mucinex With COVID and strep negative, you may return to work tomorrow

## 2022-09-20 NOTE — ED Triage Notes (Signed)
Pt presents with c/o nasal congestion and drainage that has cause a sore throat.

## 2022-09-23 LAB — CULTURE, GROUP A STREP (THRC)

## 2023-03-15 ENCOUNTER — Ambulatory Visit
Admission: EM | Admit: 2023-03-15 | Discharge: 2023-03-15 | Disposition: A | Discharge status: Home / Self Care | Payer: Self-pay | Attending: Family Medicine | Admitting: Family Medicine

## 2023-03-15 DIAGNOSIS — T148XXA Other injury of unspecified body region, initial encounter: Secondary | ICD-10-CM

## 2023-03-15 DIAGNOSIS — Z23 Encounter for immunization: Secondary | ICD-10-CM

## 2023-03-15 MED ORDER — CEPHALEXIN 500 MG PO CAPS: 500.0000 mg | ORAL_CAPSULE | Freq: Two times a day (BID) | ORAL | 0 refills | Status: AC

## 2023-03-15 MED ORDER — TETANUS-DIPHTH-ACELL PERTUSSIS 5-2.5-18.5 LF-MCG/0.5 IM SUSY
0.5000 mL | PREFILLED_SYRINGE | Freq: Once | INTRAMUSCULAR | Status: AC
  Administered 2023-03-15: 0.5 mL via INTRAMUSCULAR

## 2023-03-15 NOTE — ED Provider Notes (Signed)
[name]    CSN: [id number] Arrival date & time: 03/15/23  0818      History   Chief Complaint Chief Complaint  Patient presents with   Abrasion    HPI Samantha Rodgers is a 37 y.o. female.   HPI Pleasant 37 year old female presents with left upper arm skin avulsion that occurred on Friday, 03/11/2023 while digging under the porch.  Patient believes she caught this area on metal and request Tdap today.  Patient reports had oozing from wound which now has resolved and pain with numbness down left arm.  Patient requests note for work excuse today.  Past Medical History:  Diagnosis Date   Folliculitis     Patient Active Problem List   Diagnosis Date Noted   Folliculitis    Breast lump on right side at 10 o'clock position 12/12/2012    Past Surgical History:  Procedure Laterality Date   NO PAST SURGERIES     TONSILLECTOMY      OB History     Gravida  2   Para  1   Term  1   Preterm      AB  1   Living  1      SAB      IAB  1   Ectopic      Multiple      Live Births               Home Medications    Prior to Admission medications   Medication Sig Start Date End Date Taking? Authorizing Provider  cephALEXin (KEFLEX) 500 MG capsule Take 1 capsule (500 mg total) by mouth 2 (two) times daily for 7 days. 03/15/23 03/22/23 Yes Trevor Iha, FNP    Family History Family History  Problem Relation Age of Onset   Breast cancer Maternal Grandmother 31   Hypertension Maternal Grandmother    Diabetes Maternal Grandmother    Leukemia Paternal Grandfather    Ovarian cancer Mother        30s     Social History Social History   Tobacco Use   Smoking status: Former   Smokeless tobacco: Never  Building services engineer Use: Never used  Substance Use Topics   Alcohol use: No   Drug use: No     Allergies   Patient has no known allergies.   Review of Systems Review of Systems  Skin:  Positive for wound.  All other systems  reviewed and are negative.    Physical Exam Triage Vital Signs ED Triage Vitals  Enc Vitals Group     BP 03/15/23 0839 105/68     Pulse Rate 03/15/23 0839 (!) 56     Resp 03/15/23 0839 16     Temp 03/15/23 0839 (!) 97.5 F (36.4 C)     Temp src --      SpO2 03/15/23 0839 98 %     Weight --      Height --      Head Circumference --      Peak Flow --      Pain Score 03/15/23 0838 5     Pain Loc --      Pain Edu? --      Excl. in GC? --    No data found.  Updated Vital Signs BP 105/68   Pulse (!) 56   Temp (!) 97.5 F (36.4 C)   Resp 16   LMP 03/08/2023   SpO2 98%  Physical Exam Vitals and nursing note reviewed.  Constitutional:      General: She is not in acute distress.    Appearance: Normal appearance. She is not ill-appearing.  HENT:     Head: Normocephalic and atraumatic.     Mouth/Throat:     Mouth: Mucous membranes are moist.     Pharynx: Oropharynx is clear.  Eyes:     Extraocular Movements: Extraocular movements intact.     Conjunctiva/sclera: Conjunctivae normal.     Pupils: Pupils are equal, round, and reactive to light.  Cardiovascular:     Rate and Rhythm: Normal rate and regular rhythm.     Pulses: Normal pulses.     Heart sounds: Normal heart sounds.  Pulmonary:     Effort: Pulmonary effort is normal.     Breath sounds: Normal breath sounds. No wheezing, rhonchi or rales.  Musculoskeletal:        General: Normal range of motion.     Cervical back: Normal range of motion and neck supple.  Skin:    General: Skin is warm and dry.     Comments: Left upper arm (superior lateral aspect): Please see image below  Neurological:     General: No focal deficit present.     Mental Status: She is alert and oriented to person, place, and time. Mental status is at baseline.  Psychiatric:        Mood and Affect: Mood normal.        Behavior: Behavior normal.      UC Treatments / Results  Labs (all labs ordered are listed, but only abnormal  results are displayed) Labs Reviewed - No data to display  EKG   Radiology No results found.  Procedures Procedures (including critical care time)  Medications Ordered in UC Medications  Tdap (BOOSTRIX) injection 0.5 mL (0.5 mLs Intramuscular Given 03/15/23 0851)    Initial Impression / Assessment and Plan / UC Course  I have reviewed the triage vital signs and the nursing notes.  Pertinent labs & imaging results that were available during my care of the patient were reviewed by me and considered in my medical decision making (see chart for details).     MDM: 1.  Skin avulsion-Tdap provided in clinic and prior to discharge Rx'd Keflex 500 mg capsule twice daily x 7 days.  Work note provided to patient per request prior to discharge. Advised patient to take medication as directed with food to completion.  Encouraged increase daily water intake to 64 ounces per day.  Advised patient if symptoms worsen and/or unresolved please follow-up with PCP or here for further evaluation.  Work note provided to patient per request prior to discharge.  Patient discharged home, hemodynamically stable.  Final Clinical Impressions(s) / UC Diagnoses   Final diagnoses:  Skin avulsion     Discharge Instructions      Advised patient to take medication as directed with food to completion.  Encouraged increase daily water intake to 64 ounces per day.  Advised patient if symptoms worsen and/or unresolved please follow-up with PCP or here for further evaluation.     ED Prescriptions     Medication Sig Dispense Auth. Provider   cephALEXin (KEFLEX) 500 MG capsule Take 1 capsule (500 mg total) by mouth 2 (two) times daily for 7 days. 14 capsule Trevor Iha, FNP      PDMP not reviewed this encounter.   Trevor Iha, FNP 03/15/23 1339

## 2023-03-15 NOTE — Discharge Instructions (Addendum)
Advised patient to take medication as directed with food to completion.  Encouraged increase daily water intake to 64 ounces per day.  Advised patient if symptoms worsen and/or unresolved please follow-up with PCP or here for further evaluation.

## 2023-03-15 NOTE — ED Triage Notes (Signed)
Pt presents to uc with co of Left arm scrape from work on Friday when she was digging under a porch and she thinks she may have cut it on meatal. Pt since then has had oozing and pain with numbness down the arm.

## 2023-03-16 ENCOUNTER — Telehealth: Payer: Self-pay | Admitting: Emergency Medicine
